# Patient Record
Sex: Male | Born: 1985 | Race: Black or African American | Hispanic: No | Marital: Single | Smoking: Never smoker
Health system: Southern US, Community
[De-identification: ages and names within clinical notes are randomized; demographics above are authoritative.]

## PROBLEM LIST (undated history)

## (undated) DIAGNOSIS — W3400XA Accidental discharge from unspecified firearms or gun, initial encounter: Secondary | ICD-10-CM

## (undated) HISTORY — PX: LEG SURGERY: SHX1003

---
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
## 2018-09-29 ENCOUNTER — Emergency Department (HOSPITAL_COMMUNITY): Payer: Self-pay

## 2018-09-29 ENCOUNTER — Emergency Department (HOSPITAL_COMMUNITY)
Admission: EM | Admit: 2018-09-29 | Discharge: 2018-09-29 | Disposition: A | Discharge status: Home / Self Care | Payer: Self-pay | Attending: Emergency Medicine | Admitting: Emergency Medicine

## 2018-09-29 ENCOUNTER — Encounter (HOSPITAL_COMMUNITY): Payer: Self-pay | Admitting: Emergency Medicine

## 2018-09-29 ENCOUNTER — Other Ambulatory Visit: Payer: Self-pay

## 2018-09-29 DIAGNOSIS — Y999 Unspecified external cause status: Secondary | ICD-10-CM | POA: Insufficient documentation

## 2018-09-29 DIAGNOSIS — Y929 Unspecified place or not applicable: Secondary | ICD-10-CM | POA: Insufficient documentation

## 2018-09-29 DIAGNOSIS — X58XXXA Exposure to other specified factors, initial encounter: Secondary | ICD-10-CM | POA: Insufficient documentation

## 2018-09-29 DIAGNOSIS — S70311A Abrasion, right thigh, initial encounter: Secondary | ICD-10-CM | POA: Insufficient documentation

## 2018-09-29 DIAGNOSIS — S80811A Abrasion, right lower leg, initial encounter: Secondary | ICD-10-CM

## 2018-09-29 DIAGNOSIS — Y939 Activity, unspecified: Secondary | ICD-10-CM | POA: Insufficient documentation

## 2018-09-29 LAB — CBC WITH DIFFERENTIAL/PLATELET
Abs Immature Granulocytes: 0.12 10*3/uL — ABNORMAL HIGH (ref 0.00–0.07)
Basophils Absolute: 0.1 10*3/uL (ref 0.0–0.1)
Basophils Relative: 1 %
Eosinophils Absolute: 0.1 10*3/uL (ref 0.0–0.5)
Eosinophils Relative: 1 %
HCT: 28 % — ABNORMAL LOW (ref 39.0–52.0)
Hemoglobin: 8.7 g/dL — ABNORMAL LOW (ref 13.0–17.0)
Immature Granulocytes: 1 %
Lymphocytes Relative: 19 %
Lymphs Abs: 2.3 10*3/uL (ref 0.7–4.0)
MCH: 27.7 pg (ref 26.0–34.0)
MCHC: 31.1 g/dL (ref 30.0–36.0)
MCV: 89.2 fL (ref 80.0–100.0)
Monocytes Absolute: 0.6 10*3/uL (ref 0.1–1.0)
Monocytes Relative: 5 %
Neutro Abs: 8.6 10*3/uL — ABNORMAL HIGH (ref 1.7–7.7)
Neutrophils Relative %: 73 %
Platelets: 697 10*3/uL — ABNORMAL HIGH (ref 150–400)
RBC: 3.14 MIL/uL — ABNORMAL LOW (ref 4.22–5.81)
RDW: 17.5 % — ABNORMAL HIGH (ref 11.5–15.5)
WBC: 11.8 10*3/uL — ABNORMAL HIGH (ref 4.0–10.5)
nRBC: 0 % (ref 0.0–0.2)

## 2018-09-29 LAB — COMPREHENSIVE METABOLIC PANEL
ALT: 48 U/L — ABNORMAL HIGH (ref 0–44)
AST: 29 U/L (ref 15–41)
Albumin: 3.2 g/dL — ABNORMAL LOW (ref 3.5–5.0)
Alkaline Phosphatase: 61 U/L (ref 38–126)
Anion gap: 10 (ref 5–15)
BUN: 15 mg/dL (ref 6–20)
CO2: 24 mmol/L (ref 22–32)
Calcium: 9.3 mg/dL (ref 8.9–10.3)
Chloride: 100 mmol/L (ref 98–111)
Creatinine, Ser: 1.13 mg/dL (ref 0.61–1.24)
GFR calc Af Amer: >60 mL/min (ref >60)
GFR calc non Af Amer: >60 mL/min (ref >60)
Glucose, Bld: 105 mg/dL — ABNORMAL HIGH (ref 70–99)
Potassium: 4.6 mmol/L (ref 3.5–5.1)
Sodium: 134 mmol/L — ABNORMAL LOW (ref 135–145)
Total Bilirubin: 0.4 mg/dL (ref 0.3–1.2)
Total Protein: 7.3 g/dL (ref 6.5–8.1)

## 2018-09-29 LAB — LACTIC ACID, PLASMA: Lactic Acid, Venous: 1.5 mmol/L (ref 0.5–1.9)

## 2018-09-29 MED ORDER — SODIUM CHLORIDE 0.9% FLUSH: 3.0000 mL | Freq: Once | INTRAVENOUS | Status: DC

## 2018-09-29 NOTE — ED Triage Notes (Signed)
Patient arrived with EMS from home reports right groin discomfort "irritation" onset this morning , recent surgery at right groin 5 days ago at [facility] , incision intact /no bleeding or discharge .
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
## 2018-09-29 NOTE — Discharge Instructions (Addendum)
Follow up with your Orthopaedist for recheck as scheduled

## 2018-12-22 ENCOUNTER — Emergency Department (HOSPITAL_COMMUNITY)
Admission: EM | Admit: 2018-12-22 | Discharge: 2018-12-22 | Disposition: A | Discharge status: Home / Self Care | Payer: Self-pay | Attending: Emergency Medicine | Admitting: Emergency Medicine

## 2018-12-22 ENCOUNTER — Other Ambulatory Visit: Payer: Self-pay

## 2018-12-22 ENCOUNTER — Encounter (HOSPITAL_COMMUNITY): Payer: Self-pay

## 2018-12-22 DIAGNOSIS — Y929 Unspecified place or not applicable: Secondary | ICD-10-CM | POA: Insufficient documentation

## 2018-12-22 DIAGNOSIS — Y999 Unspecified external cause status: Secondary | ICD-10-CM | POA: Insufficient documentation

## 2018-12-22 DIAGNOSIS — W3400XS Accidental discharge from unspecified firearms or gun, sequela: Secondary | ICD-10-CM | POA: Insufficient documentation

## 2018-12-22 DIAGNOSIS — S81832D Puncture wound without foreign body, left lower leg, subsequent encounter: Secondary | ICD-10-CM

## 2018-12-22 DIAGNOSIS — S81802A Unspecified open wound, left lower leg, initial encounter: Secondary | ICD-10-CM | POA: Insufficient documentation

## 2018-12-22 DIAGNOSIS — Y939 Activity, unspecified: Secondary | ICD-10-CM | POA: Insufficient documentation

## 2018-12-22 DIAGNOSIS — W3400XD Accidental discharge from unspecified firearms or gun, subsequent encounter: Secondary | ICD-10-CM

## 2018-12-22 MED ORDER — CEPHALEXIN 500 MG PO CAPS: 500.0000 mg | ORAL_CAPSULE | Freq: Four times a day (QID) | ORAL | 0 refills | Status: AC
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
## 2018-12-22 NOTE — Discharge Instructions (Signed)
Thank you for allowing me to care for you today in the Emergency Department.   Keep abdominal consult every 6 hours for the next 5 days to prevent infection.  Make sure to take the entire course of antibiotics even if your symptoms have improved.  To care for your wound at home, y keep the area clean with warm water and soap.  This is most easily done to take a shower.  After get out of shower, you can pat the area dry.  Then, apply a topical antibiotic, such as bacitracin or Neosporin directly to the skin of the wound.  Keep the area covered with a bandage until the skin has healed and closed.  Avoid submerging your lower leg in water for prolonged period of time, such as in a hot tub or a bath until the skin has healed.  You can take Tylenol or ibuprofen for pain.  Your tetanus immunization was updated in August 2020.  If you develop a fever, chills, or if the skin around the wound gets red, hot to the touch, very painful, if you develop red streaking to the skin, or if you start to have thick, mucus-like drainage from the wound, you need to have this reevaluated by primary care or by returning to the ER.

## 2019-06-01 ENCOUNTER — Other Ambulatory Visit: Payer: Self-pay

## 2019-06-01 ENCOUNTER — Encounter (HOSPITAL_COMMUNITY): Payer: Self-pay

## 2019-06-01 ENCOUNTER — Emergency Department (HOSPITAL_COMMUNITY)
Admission: EM | Admit: 2019-06-01 | Discharge: 2019-06-01 | Disposition: A | Discharge status: Home / Self Care | Payer: Medicaid Other | Attending: Emergency Medicine | Admitting: Emergency Medicine

## 2019-06-01 DIAGNOSIS — M5441 Lumbago with sciatica, right side: Secondary | ICD-10-CM

## 2019-06-01 DIAGNOSIS — X503XXA Overexertion from repetitive movements, initial encounter: Secondary | ICD-10-CM | POA: Insufficient documentation

## 2019-06-01 DIAGNOSIS — G8929 Other chronic pain: Secondary | ICD-10-CM | POA: Insufficient documentation

## 2019-06-01 DIAGNOSIS — S39012A Strain of muscle, fascia and tendon of lower back, initial encounter: Secondary | ICD-10-CM | POA: Insufficient documentation

## 2019-06-01 DIAGNOSIS — Y9301 Activity, walking, marching and hiking: Secondary | ICD-10-CM | POA: Insufficient documentation

## 2019-06-01 DIAGNOSIS — Y990 Civilian activity done for income or pay: Secondary | ICD-10-CM | POA: Insufficient documentation

## 2019-06-01 DIAGNOSIS — Y9259 Other trade areas as the place of occurrence of the external cause: Secondary | ICD-10-CM | POA: Insufficient documentation

## 2019-06-01 MED ORDER — METHOCARBAMOL 500 MG PO TABS
500.0000 mg | ORAL_TABLET | Freq: Once | ORAL | Status: AC
  Administered 2019-06-01: 500 mg via ORAL
  Filled 2019-06-01: qty 1

## 2019-06-01 MED ORDER — METHOCARBAMOL 500 MG PO TABS: 500.0000 mg | ORAL_TABLET | Freq: Two times a day (BID) | ORAL | 0 refills | Status: AC

## 2019-06-01 MED ORDER — KETOROLAC TROMETHAMINE 30 MG/ML IJ SOLN
30.0000 mg | Freq: Once | INTRAMUSCULAR | Status: AC
  Administered 2019-06-01: 30 mg via INTRAMUSCULAR
  Filled 2019-06-01: qty 1

## 2019-06-01 MED ORDER — NAPROXEN 500 MG PO TABS: 500.0000 mg | ORAL_TABLET | Freq: Two times a day (BID) | ORAL | 0 refills | Status: AC

## 2019-06-01 NOTE — Discharge Instructions (Addendum)
Take medications to help with your symptoms. Follow-up with the specialist listed below for further evaluation. Return to the ER for any worsening back pain, chest pain, injuries or falls, losing control of your bowels or bladder.

## 2019-06-01 NOTE — ED Notes (Signed)
Pt verbalizes understanding of DC instructions. Pt belongings returned and is ambulatory out of ED.  

## 2019-06-01 NOTE — ED Triage Notes (Signed)
Patient c/o lower back pain. Patient states he has had back pain since he was a kid, but is worsening now. Patient denies any radiating of pain down his legs.

## 2019-06-01 NOTE — ED Provider Notes (Signed)
[facility]-EMERGENCY DEPT Provider Note   CSN: [id number] Arrival date & time: 06/01/19  1509     History Chief Complaint  Patient presents with  . Back Pain    Kevin Peters is a 34 y.o. male who presents to ED with a chief complaint of back pain.  States that he has had intermittent lower back pain ever since he was a teenager.  He states that the current episode worsened about a year ago.  He job requires a lot of walking and he feels that that is what worsened his pain this week. Describes the pain as sharp, worse with movement. States that he has been taking some medications that were prescribed to him in the past for back pain but does not know the name.  Reports minimal improvement with this.  He denies any injuries or falls in the past causing the back pain, numbness in arms or, prior back surgeries, loss of bowel or bladder function, dysuria, fever, chest pain, changes to gait. He is concerned that he may have sciatica after Googling his symptoms.  HPI     History reviewed. No pertinent past medical history.  There are no problems to display for this patient.   Past Surgical History:  Procedure Laterality Date  . LEG SURGERY         Family History  Problem Relation Age of Onset  . Hypertension Mother   . Cancer Father     Social History   Tobacco Use  . Smoking status: Never Smoker  . Smokeless tobacco: Never Used  Substance Use Topics  . Alcohol use: Never  . Drug use: Never    Home Medications Prior to Admission medications   Medication Sig Start Date End Date Taking? Authorizing Provider  methocarbamol (ROBAXIN) 500 MG tablet Take 1 tablet (500 mg total) by mouth 2 (two) times daily. 06/01/19   Bayden Gil, PA-C  naproxen (NAPROSYN) 500 MG tablet Take 1 tablet (500 mg total) by mouth 2 (two) times daily. 06/01/19   Dietrich Pates, PA-C    Allergies    Patient has no known allergies.  Review of Systems   Review of Systems    Cardiovascular: Negative for chest pain.  Genitourinary: Negative for dysuria.  Musculoskeletal: Positive for back pain and myalgias.  Skin: Negative for wound.  Neurological: Negative for weakness and numbness.    Physical Exam Updated Vital Signs BP (!) 150/99 (BP Location: Left Arm)   Pulse 72   Temp 98.8 F (37.1 C) (Oral)   Resp 16   Ht 5\' 9"  (1.753 m)   Wt 78.5 kg   SpO2 97%   BMI 25.55 kg/m   Physical Exam Vitals and nursing note reviewed.  Constitutional:      General: He is not in acute distress.    Appearance: He is well-developed. He is not diaphoretic.  HENT:     Head: Normocephalic and atraumatic.  Eyes:     General: No scleral icterus.    Conjunctiva/sclera: Conjunctivae normal.  Pulmonary:     Effort: Pulmonary effort is normal. No respiratory distress.  Musculoskeletal:     Cervical back: Normal range of motion.     Lumbar back: Tenderness present.       Back:     Comments: No midline spinal tenderness present in lumbar, thoracic or cervical spine. No step-off palpated. No visible bruising, edema or temperature change noted. No objective signs of numbness present. No saddle anesthesia. 2+ DP pulses bilaterally.  Sensation intact to light touch. Strength 5/5 in bilateral lower extremities.  Skin:    Findings: No rash.  Neurological:     Mental Status: He is alert.     ED Results / Procedures / Treatments   Labs (all labs ordered are listed, but only abnormal results are displayed) Labs Reviewed - No data to display  EKG None  Radiology No results found.  Procedures Procedures (including critical care time)  Medications Ordered in ED Medications  ketorolac (TORADOL) 30 MG/ML injection 30 mg (has no administration in time range)  methocarbamol (ROBAXIN) tablet 500 mg (has no administration in time range)    ED Course  I have reviewed the triage vital signs and the nursing notes.  Pertinent labs & imaging results that were available  during my care of the patient were reviewed by me and considered in my medical decision making (see chart for details).    MDM Rules/Calculators/A&P                      33 year old male presents to ED with a chief complaint of acute on chronic lower back pain.  Symptoms have been going on since he was a teenager.  Reports increased walking at work exacerbated his symptoms with his current episode.  Patient denies any concerning symptoms suggestive of cauda equina requiring urgent imaging at this time such as loss of sensation in the lower extremities, lower extremity weakness, loss of bowel or bladder control, saddle anesthesia, urinary retention, fever/chills, IVDU. Exam demonstrated no  weakness on exam today. No preceding injury or trauma to suggest acute fracture. Doubt pelvic or urinary pathology for patient's acute back pain, as patient denies urinary symptoms. Doubt AAA as cause of patient's back pain as patient lacks major risk factors, had no abdominal TTP, and has symmetric and intact distal pulses. Patient given strict return precautions for any symptoms indicating worsening neurologic function in the lower extremities.  All imaging, if done today, including plain films, CT scans, and ultrasounds, independently reviewed by me, and interpretations confirmed via formal radiology reads.  Patient is hemodynamically stable, in NAD, and able to ambulate in the ED. Evaluation does not show pathology that would require ongoing emergent intervention or inpatient treatment. I explained the diagnosis to the patient. Pain has been managed and has no complaints prior to discharge. Patient is comfortable with above plan and is stable for discharge at this time. All questions were answered prior to disposition. Strict return precautions for returning to the ED were discussed. Encouraged follow up with PCP.   An After Visit Summary was printed and given to the patient.   Portions of this note were  generated with [employer]. Dictation errors may occur despite best attempts at proofreading.   Final Clinical Impression(s) / ED Diagnoses Final diagnoses:  Strain of lumbar region, initial encounter  Chronic bilateral low back pain with bilateral sciatica    Rx / DC Orders ED Discharge Orders         Ordered    naproxen (NAPROSYN) 500 MG tablet  2 times daily     06/01/19 1553    methocarbamol (ROBAXIN) 500 MG tablet  2 times daily     06/01/19 1553           Delia Heady, PA-C 06/01/19 1553    Valarie Merino, MD 06/02/19 2334

## 2019-07-09 ENCOUNTER — Encounter (HOSPITAL_COMMUNITY): Payer: Self-pay

## 2019-07-09 ENCOUNTER — Other Ambulatory Visit: Payer: Self-pay

## 2019-07-09 ENCOUNTER — Emergency Department (HOSPITAL_COMMUNITY)
Admission: EM | Admit: 2019-07-09 | Discharge: 2019-07-09 | Disposition: A | Discharge status: Home / Self Care | Payer: Medicaid Other | Attending: Emergency Medicine | Admitting: Emergency Medicine

## 2019-07-09 DIAGNOSIS — R2242 Localized swelling, mass and lump, left lower limb: Secondary | ICD-10-CM | POA: Insufficient documentation

## 2019-07-09 DIAGNOSIS — Z5321 Procedure and treatment not carried out due to patient leaving prior to being seen by health care provider: Secondary | ICD-10-CM | POA: Insufficient documentation

## 2019-07-09 HISTORY — DX: Accidental discharge from unspecified firearms or gun, initial encounter: W34.00XA

## 2019-07-09 NOTE — ED Notes (Signed)
Called patient in the lobby and outside to come back to a room and no one responded
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
## 2021-12-27 ENCOUNTER — Emergency Department (HOSPITAL_COMMUNITY)
Admission: EM | Admit: 2021-12-27 | Discharge: 2021-12-27 | Disposition: A | Discharge status: Home / Self Care | Payer: Medicaid Other | Attending: Emergency Medicine | Admitting: Emergency Medicine

## 2021-12-27 ENCOUNTER — Other Ambulatory Visit: Payer: Self-pay

## 2021-12-27 DIAGNOSIS — Z48817 Encounter for surgical aftercare following surgery on the skin and subcutaneous tissue: Secondary | ICD-10-CM | POA: Insufficient documentation

## 2021-12-27 DIAGNOSIS — Z4889 Encounter for other specified surgical aftercare: Secondary | ICD-10-CM
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
## 2021-12-27 NOTE — Discharge Instructions (Signed)
Please return to ED with any new or worsening signs or symptoms Please follow-up with general surgeon who performed initial surgery Please keep area on leg covered for the next 2 to 3 days

## 2021-12-27 NOTE — ED Triage Notes (Signed)
Pt states he had a graft last year on left lower leg. He recently started working out and one of the stitches on leg "burst open"

## 2024-01-27 ENCOUNTER — Other Ambulatory Visit: Payer: Self-pay

## 2024-01-27 ENCOUNTER — Emergency Department (HOSPITAL_COMMUNITY)
Admission: EM | Admit: 2024-01-27 | Discharge: 2024-01-27 | Discharge status: Against Medical Advice | Attending: Emergency Medicine | Admitting: Emergency Medicine

## 2024-01-27 DIAGNOSIS — R1084 Generalized abdominal pain: Secondary | ICD-10-CM | POA: Diagnosis present

## 2024-01-27 DIAGNOSIS — R110 Nausea: Secondary | ICD-10-CM | POA: Diagnosis not present

## 2024-01-27 DIAGNOSIS — Z5321 Procedure and treatment not carried out due to patient leaving prior to being seen by health care provider: Secondary | ICD-10-CM | POA: Insufficient documentation

## 2024-01-27 DIAGNOSIS — R197 Diarrhea, unspecified: Secondary | ICD-10-CM | POA: Insufficient documentation

## 2024-01-27 NOTE — ED Triage Notes (Signed)
 Patient c/o Generalized abdominal pain x 4 days. Patient report nausea denies vomiting. Patient report watery loose stool x 3 today. Patient denies fever. Patient denies dysuria.
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
